# Patient Record
Sex: Male | Born: 1990 | Race: White | Hispanic: No | Marital: Single | Smoking: Never smoker
Health system: Southern US, Community
[De-identification: ages and names within clinical notes are randomized; demographics above are authoritative.]

## PROBLEM LIST (undated history)

## (undated) DIAGNOSIS — K219 Gastro-esophageal reflux disease without esophagitis: Secondary | ICD-10-CM

## (undated) HISTORY — PX: VARICOCELECTOMY: SHX1084

## (undated) HISTORY — PX: APPENDECTOMY: SHX54

## (undated) HISTORY — PX: TONSILLECTOMY: SUR1361

---

## 2017-12-25 ENCOUNTER — Other Ambulatory Visit: Payer: Self-pay

## 2017-12-25 ENCOUNTER — Encounter (HOSPITAL_COMMUNITY): Payer: Self-pay

## 2017-12-25 ENCOUNTER — Telehealth: Payer: Self-pay | Admitting: Orthopedic Surgery

## 2017-12-25 ENCOUNTER — Emergency Department (HOSPITAL_COMMUNITY)
Admission: EM | Admit: 2017-12-25 | Discharge: 2017-12-25 | Disposition: A | Discharge status: Home / Self Care | Payer: BLUE CROSS/BLUE SHIELD | Attending: Emergency Medicine | Admitting: Emergency Medicine

## 2017-12-25 DIAGNOSIS — M5431 Sciatica, right side: Secondary | ICD-10-CM | POA: Diagnosis not present

## 2017-12-25 DIAGNOSIS — M545 Low back pain: Secondary | ICD-10-CM | POA: Diagnosis present

## 2017-12-25 HISTORY — DX: Gastro-esophageal reflux disease without esophagitis: K21.9

## 2017-12-25 MED ORDER — METHYLPREDNISOLONE 4 MG PO TBPK: ORAL_TABLET | ORAL | 0 refills | Status: DC

## 2017-12-25 MED ORDER — NAPROXEN 250 MG PO TABS
500.0000 mg | ORAL_TABLET | Freq: Once | ORAL | Status: AC
  Administered 2017-12-25: 500 mg via ORAL
  Filled 2017-12-25: qty 2

## 2017-12-25 MED ORDER — METHOCARBAMOL 500 MG PO TABS: 500.0000 mg | ORAL_TABLET | Freq: Two times a day (BID) | ORAL | 0 refills | Status: DC

## 2017-12-25 MED ORDER — PREDNISONE 50 MG PO TABS
60.0000 mg | ORAL_TABLET | Freq: Once | ORAL | Status: AC
  Administered 2017-12-25: 60 mg via ORAL
  Filled 2017-12-25: qty 1

## 2017-12-25 MED ORDER — METHOCARBAMOL 500 MG PO TABS
1000.0000 mg | ORAL_TABLET | Freq: Once | ORAL | Status: AC
  Administered 2017-12-25: 1000 mg via ORAL
  Filled 2017-12-25: qty 2

## 2017-12-25 NOTE — ED Notes (Signed)
Pt states he has been in constant pain and is looking to speak to ortho or neuro or anyone who can help him feel relief from his chronic back issue.

## 2017-12-25 NOTE — Telephone Encounter (Signed)
[facility] called [facility] for problem of back pain/sciatica.  Relayed that we have no providers in clinic this week. In discussing option of next available and/or contacting primary care, patient states was already seen by his primary care doctor in [place] on Friday, 12/22/17 and had Xray done there.  States he is trying to get an MRI.  Relayed that since under care of his doctor for this problem already, to call back to their office to be further advised. York SpanielSaid will do so, and call back if needs anything.

## 2017-12-25 NOTE — ED Provider Notes (Signed)
[facility] EMERGENCY DEPARTMENT Provider Note   CSN: [id number] Arrival date & time: 12/25/17  0551     History   Chief Complaint Chief Complaint  Patient presents with  . Back Pain    HPI Steadman Prosperi is a 27 y.o. male.  Patient with right-sided low back pain that radiates down his right leg that has been progressively worsening for the past 4 days.  Denies any injury but states he was bending over a lot cleaning out his car.  He saw his PCP and was given Flexeril which he says is not helping and he had a negative x-ray.  Patient states he has had back problems on and off for the past 10 years always in this same location.  He  has seen chiropractors and orthopedic doctors in other cities.  He has not had surgery.  He denies any change in his chronic pain location and chronic pain pattern.  No weakness, numbness, tingling.  No bowel or bladder incontinence.  No fever or vomiting.  No history of cancer or IV drug use.  He denies any pain with urination or blood in the urine.  He is also been trying gabapentin which he has at home from previous testicular surgery.  He denies any testicle pain today.  His PCP referred him to [facility] orthopedics but he states he cannot be seen for 3 weeks.  The history is provided by the patient.  Back Pain   Pertinent negatives include no chest pain, no fever, no headaches, no abdominal pain, no dysuria and no weakness.    Past Medical History:  Diagnosis Date  . GERD (gastroesophageal reflux disease)     There are no active problems to display for this patient.   Past Surgical History:  Procedure Laterality Date  . APPENDECTOMY    . TONSILLECTOMY          Home Medications    Prior to Admission medications   Medication Sig Start Date End Date Taking? Authorizing Provider  cyclobenzaprine (FLEXERIL) 10 MG tablet  12/22/17  Yes [provider]  gabapentin (NEURONTIN) 300 MG capsule  10/19/17  Yes [provider]    pantoprazole (PROTONIX) 40 MG tablet  12/12/17  Yes [provider]    Family History Family History  Problem Relation Age of Onset  . Hyperlipidemia Mother   . Hypertension Mother   . Hypertension Father   . Diabetes Father     Social History Social History   Tobacco Use  . Smoking status: Never Smoker  . Smokeless tobacco: Never Used  Substance Use Topics  . Alcohol use: Never    Frequency: Never  . Drug use: Never     Allergies   Oxycodone and Percocet [oxycodone-acetaminophen]   Review of Systems Review of Systems  Constitutional: Negative for activity change, appetite change and fever.  HENT: Negative for congestion.   Eyes: Negative for visual disturbance.  Respiratory: Negative for cough, chest tightness and shortness of breath.   Cardiovascular: Negative for chest pain.  Gastrointestinal: Negative for abdominal pain, nausea and vomiting.  Genitourinary: Negative for dysuria and hematuria.  Musculoskeletal: Positive for arthralgias, back pain and myalgias.  Skin: Negative for rash.  Neurological: Negative for dizziness, weakness and headaches.    all other systems are negative except as noted in the HPI and PMH.    Physical Exam Updated Vital Signs BP 107/82 (BP Location: Left Arm)   Pulse 86   Temp 97.8 F (36.6 C) (Oral)  Resp 16   Ht 5\' 10"  (1.778 m)   Wt 129.3 kg (285 lb)   SpO2 100%   BMI 40.89 kg/m   Physical Exam  Constitutional: He is oriented to person, place, and time. He appears well-developed and well-nourished. No distress.  obese  HENT:  Head: Normocephalic and atraumatic.  Mouth/Throat: Oropharynx is clear and moist. No oropharyngeal exudate.  Eyes: Pupils are equal, round, and reactive to light. Conjunctivae and EOM are normal.  Neck: Normal range of motion. Neck supple.  No meningismus.  Cardiovascular: Normal rate, regular rhythm, normal heart sounds and intact distal pulses.  No murmur heard. Pulmonary/Chest:  Effort normal and breath sounds normal. No respiratory distress.  Abdominal: Soft. There is no tenderness. There is no rebound and no guarding.  Musculoskeletal: Normal range of motion. He exhibits tenderness. He exhibits no edema.  Right SI joint tenderness 5/5 strength in bilateral lower extremities. Ankle plantar and dorsiflexion intact. Great toe extension intact bilaterally. +2 DP and PT pulses. +2 patellar reflexes bilaterally. Antalgic gait   Neurological: He is alert and oriented to person, place, and time. No cranial nerve deficit. He exhibits normal muscle tone. Coordination normal.  No ataxia on finger to nose bilaterally. No pronator drift. 5/5 strength throughout. CN 2-12 intact.Equal grip strength. Sensation intact.   Skin: Skin is warm.  Psychiatric: He has a normal mood and affect. His behavior is normal.  Nursing note and vitals reviewed.    ED Treatments / Results  Labs (all labs ordered are listed, but only abnormal results are displayed) Labs Reviewed - No data to display  EKG None  Radiology No results found.  Procedures Procedures (including critical care time)  Medications Ordered in ED Medications - No data to display   Initial Impression / Assessment and Plan / ED Course  I have reviewed the triage vital signs and the nursing notes.  Pertinent labs & imaging results that were available during my care of the patient were reviewed by me and considered in my medical decision making (see chart for details).    Acute on chronic right-sided low back pain with sciatica.  Patient with normal strength and sensation and reflexes today.  No neurological red flags.  Low suspicion for cord compression or cauda equina. Patient is not requesting narcotic pain medication states it makes him sick.  He agrees with nonnarcotic treatments.    Patient will be treated with steroids and anti-inflammatories.  He will be referred to local orthopedics by his request.  No  indication for emergent MRI today.  Return precautions discussed. Final Clinical Impressions(s) / ED Diagnoses   Final diagnoses:  Sciatica of right side    ED Discharge Orders    None       Winter Trefz, Jeannett SeniorStephen, MD 12/25/17 [phone]

## 2017-12-25 NOTE — Discharge Instructions (Addendum)
Follow-up with the orthopedic doctor for an MRI.  Avoid heavy lifting.  Return to the ED if you develop new weakness, numbness, incontinence, fever, vomiting or worsening pain or any other concerns.

## 2017-12-25 NOTE — ED Triage Notes (Signed)
Pt arrives from home via POV c/o lumbar back pain. Pt states he has been treated for herniated discs in the past and has not felt relief from previous therapy.

## 2018-04-08 ENCOUNTER — Other Ambulatory Visit: Payer: Self-pay

## 2018-04-08 ENCOUNTER — Encounter (HOSPITAL_COMMUNITY): Payer: Self-pay | Admitting: Emergency Medicine

## 2018-04-08 ENCOUNTER — Emergency Department (HOSPITAL_COMMUNITY)
Admission: EM | Admit: 2018-04-08 | Discharge: 2018-04-09 | Disposition: A | Discharge status: Home / Self Care | Payer: BLUE CROSS/BLUE SHIELD | Attending: Emergency Medicine | Admitting: Emergency Medicine

## 2018-04-08 ENCOUNTER — Emergency Department (HOSPITAL_COMMUNITY): Payer: BLUE CROSS/BLUE SHIELD

## 2018-04-08 DIAGNOSIS — Z79899 Other long term (current) drug therapy: Secondary | ICD-10-CM | POA: Insufficient documentation

## 2018-04-08 DIAGNOSIS — R091 Pleurisy: Secondary | ICD-10-CM | POA: Diagnosis not present

## 2018-04-08 DIAGNOSIS — R079 Chest pain, unspecified: Secondary | ICD-10-CM | POA: Diagnosis present

## 2018-04-08 MED ORDER — SODIUM CHLORIDE 0.9 % IV BOLUS: 1000.0000 mL | Freq: Once | INTRAVENOUS | Status: DC

## 2018-04-08 MED ORDER — KETOROLAC TROMETHAMINE 30 MG/ML IJ SOLN: 15.0000 mg | Freq: Once | INTRAMUSCULAR | Status: DC

## 2018-04-08 NOTE — ED Notes (Signed)
Patient transported to X-ray 

## 2018-04-08 NOTE — ED Triage Notes (Signed)
Pt c/o chest pain and SOB x 5 days

## 2018-04-09 LAB — COMPREHENSIVE METABOLIC PANEL
ALBUMIN: 4 g/dL (ref 3.5–5.0)
ALT: 28 U/L (ref 0–44)
ANION GAP: 6 (ref 5–15)
AST: 17 U/L (ref 15–41)
Alkaline Phosphatase: 68 U/L (ref 38–126)
BUN: 14 mg/dL (ref 6–20)
CO2: 27 mmol/L (ref 22–32)
Calcium: 9 mg/dL (ref 8.9–10.3)
Chloride: 105 mmol/L (ref 98–111)
Creatinine, Ser: 0.77 mg/dL (ref 0.61–1.24)
Glucose, Bld: 94 mg/dL (ref 70–99)
POTASSIUM: 3.8 mmol/L (ref 3.5–5.1)
Sodium: 138 mmol/L (ref 135–145)
Total Bilirubin: 0.6 mg/dL (ref 0.3–1.2)
Total Protein: 6.9 g/dL (ref 6.5–8.1)

## 2018-04-09 LAB — CBC
HCT: 42.1 % (ref 39.0–52.0)
Hemoglobin: 13.6 g/dL (ref 13.0–17.0)
MCH: 26.3 pg (ref 26.0–34.0)
MCHC: 32.3 g/dL (ref 30.0–36.0)
MCV: 81.3 fL (ref 78.0–100.0)
Platelets: 272 10*3/uL (ref 150–400)
RBC: 5.18 MIL/uL (ref 4.22–5.81)
RDW: 13.4 % (ref 11.5–15.5)
WBC: 9.6 10*3/uL (ref 4.0–10.5)

## 2018-04-09 LAB — D-DIMER, QUANTITATIVE: D-Dimer, Quant: <0.27 ug/mL-FEU (ref 0.00–0.50)

## 2018-04-09 LAB — TROPONIN I: Troponin I: <0.03 ng/mL (ref <0.03)

## 2018-04-09 MED ORDER — PREDNISONE 50 MG PO TABS
60.0000 mg | ORAL_TABLET | Freq: Once | ORAL | Status: AC
  Administered 2018-04-09: 60 mg via ORAL
  Filled 2018-04-09: qty 1

## 2018-04-09 MED ORDER — PREDNISONE 50 MG PO TABS: ORAL_TABLET | ORAL | 0 refills | Status: AC

## 2018-04-09 MED ORDER — KETOROLAC TROMETHAMINE 30 MG/ML IJ SOLN
30.0000 mg | Freq: Once | INTRAMUSCULAR | Status: AC
Start: 2018-04-09 — End: 2018-04-09
  Administered 2018-04-09: 30 mg via INTRAMUSCULAR
  Filled 2018-04-09: qty 1

## 2018-04-09 MED ORDER — KETOROLAC TROMETHAMINE 30 MG/ML IJ SOLN
30.0000 mg | Freq: Once | INTRAMUSCULAR | Status: DC
Start: 2018-04-09 — End: 2018-04-09

## 2018-04-09 NOTE — Discharge Instructions (Addendum)
Take your next dose of the prednisone Monday night - take with a snack. Plan a recheck by your provider by the end of this week for any persistent or worsened symptoms.  As discussed, your labs, ekg tracing and your chest xray are negative for any obvious cardiac source for your symptoms, blood clot (pulmonary embolus) or pneumonia.

## 2018-04-09 NOTE — ED Notes (Signed)
Pt given two 8 oz glasses of water and one 8 oz sprite- tolerating well.
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
## 2018-04-18 ENCOUNTER — Emergency Department (HOSPITAL_COMMUNITY): Payer: BLUE CROSS/BLUE SHIELD

## 2018-04-18 ENCOUNTER — Encounter (HOSPITAL_COMMUNITY): Payer: Self-pay | Admitting: Emergency Medicine

## 2018-04-18 ENCOUNTER — Emergency Department (HOSPITAL_COMMUNITY)
Admission: EM | Admit: 2018-04-18 | Discharge: 2018-04-18 | Disposition: A | Discharge status: Home / Self Care | Payer: BLUE CROSS/BLUE SHIELD | Attending: Emergency Medicine | Admitting: Emergency Medicine

## 2018-04-18 ENCOUNTER — Other Ambulatory Visit: Payer: Self-pay

## 2018-04-18 DIAGNOSIS — R197 Diarrhea, unspecified: Secondary | ICD-10-CM | POA: Insufficient documentation

## 2018-04-18 DIAGNOSIS — R109 Unspecified abdominal pain: Secondary | ICD-10-CM | POA: Insufficient documentation

## 2018-04-18 DIAGNOSIS — Z79899 Other long term (current) drug therapy: Secondary | ICD-10-CM | POA: Diagnosis not present

## 2018-04-18 DIAGNOSIS — R112 Nausea with vomiting, unspecified: Secondary | ICD-10-CM | POA: Insufficient documentation

## 2018-04-18 LAB — URINALYSIS, ROUTINE W REFLEX MICROSCOPIC
BILIRUBIN URINE: NEGATIVE
Bacteria, UA: NONE SEEN
Glucose, UA: NEGATIVE mg/dL
KETONES UR: 5 mg/dL — AB
Leukocytes, UA: NEGATIVE
NITRITE: NEGATIVE
PROTEIN: NEGATIVE mg/dL
Specific Gravity, Urine: 1.024 (ref 1.005–1.030)
pH: 5 (ref 5.0–8.0)

## 2018-04-18 LAB — COMPREHENSIVE METABOLIC PANEL
ALK PHOS: 68 U/L (ref 38–126)
ALT: 26 U/L (ref 0–44)
ANION GAP: 9 (ref 5–15)
AST: 18 U/L (ref 15–41)
Albumin: 4.3 g/dL (ref 3.5–5.0)
BUN: 12 mg/dL (ref 6–20)
CALCIUM: 9.1 mg/dL (ref 8.9–10.3)
CO2: 28 mmol/L (ref 22–32)
Chloride: 101 mmol/L (ref 98–111)
Creatinine, Ser: 1 mg/dL (ref 0.61–1.24)
GFR calc non Af Amer: >60 mL/min (ref >60)
Glucose, Bld: 86 mg/dL (ref 70–99)
Potassium: 4.1 mmol/L (ref 3.5–5.1)
SODIUM: 138 mmol/L (ref 135–145)
Total Bilirubin: 1.2 mg/dL (ref 0.3–1.2)
Total Protein: 7.4 g/dL (ref 6.5–8.1)

## 2018-04-18 LAB — CBC
HCT: 43.2 % (ref 39.0–52.0)
HEMOGLOBIN: 13.9 g/dL (ref 13.0–17.0)
MCH: 26.3 pg (ref 26.0–34.0)
MCHC: 32.2 g/dL (ref 30.0–36.0)
MCV: 81.8 fL (ref 78.0–100.0)
Platelets: 290 10*3/uL (ref 150–400)
RBC: 5.28 MIL/uL (ref 4.22–5.81)
RDW: 13.3 % (ref 11.5–15.5)
WBC: 13.1 10*3/uL — ABNORMAL HIGH (ref 4.0–10.5)

## 2018-04-18 LAB — LIPASE, BLOOD: LIPASE: 24 U/L (ref 11–51)

## 2018-04-18 MED ORDER — MORPHINE SULFATE (PF) 4 MG/ML IV SOLN
4.0000 mg | Freq: Once | INTRAVENOUS | Status: DC
  Filled 2018-04-18: qty 1

## 2018-04-18 MED ORDER — ONDANSETRON HCL 4 MG/2ML IJ SOLN
4.0000 mg | Freq: Once | INTRAMUSCULAR | Status: AC
  Administered 2018-04-18: 4 mg via INTRAVENOUS
  Filled 2018-04-18: qty 2

## 2018-04-18 MED ORDER — IOPAMIDOL (ISOVUE-300) INJECTION 61%: 100.0000 mL | Freq: Once | INTRAVENOUS | Status: DC | PRN

## 2018-04-18 MED ORDER — IOHEXOL 300 MG/ML  SOLN
100.0000 mL | Freq: Once | INTRAMUSCULAR | Status: AC | PRN
  Administered 2018-04-18: 100 mL via INTRAVENOUS

## 2018-04-18 MED ORDER — SODIUM CHLORIDE 0.9 % IV BOLUS
1000.0000 mL | Freq: Once | INTRAVENOUS | Status: AC
  Administered 2018-04-18: 1000 mL via INTRAVENOUS

## 2018-04-18 MED ORDER — PANTOPRAZOLE SODIUM 20 MG PO TBEC: 20.0000 mg | DELAYED_RELEASE_TABLET | Freq: Every day | ORAL | 0 refills | Status: AC

## 2018-04-18 MED ORDER — DIPHENOXYLATE-ATROPINE 2.5-0.025 MG PO TABS: 1.0000 | ORAL_TABLET | Freq: Four times a day (QID) | ORAL | 0 refills | Status: AC | PRN

## 2018-04-18 NOTE — ED Triage Notes (Signed)
Pt c/o n/v/d. Seen pcp. Was given zofran. No vomiting since yesterday. But diarrhea is worse. Blood in stool Monday but not since. 5 episodes of diarrhea today

## 2018-04-18 NOTE — ED Provider Notes (Signed)
[facility] EMERGENCY DEPARTMENT Provider Note   CSN: [id number] Arrival date & time: 04/18/18  1439     History   Chief Complaint Chief Complaint  Patient presents with  . Emesis  . Diarrhea    HPI Fernando Ward is a 27 y.o. male.  Multiple bouts of nausea, vomiting, diarrhea for several days.  Patient was given Zofran by his primary care doctor.  He noticed a small amount of blood in his stool on Monday but none since then.  No mucus in his stool.  No previous gastrointestinal pathology.  He blames and symptoms on something he ate.  Severity is moderate.  Nothing makes symptoms better or worse.  Review of systems positive for abdominal cramping     Past Medical History:  Diagnosis Date  . GERD (gastroesophageal reflux disease)     There are no active problems to display for this patient.   Past Surgical History:  Procedure Laterality Date  . APPENDECTOMY    . TONSILLECTOMY    . VARICOCELECTOMY          Home Medications    Prior to Admission medications   Medication Sig Start Date End Date Taking? Authorizing Provider  bismuth subsalicylate (PEPTO BISMOL) 262 MG/15ML suspension Take 30 mLs by mouth every 6 (six) hours as needed for indigestion.   Yes [provider]  ranitidine (ZANTAC) 150 MG tablet Take 150 mg by mouth daily as needed for heartburn.    Yes [provider]  amoxicillin-clavulanate (AUGMENTIN) 875-125 MG tablet Take 1 tablet by mouth 2 (two) times daily. for 10 days 04/13/18   [provider]  diphenoxylate-atropine (LOMOTIL) 2.5-0.025 MG tablet Take 1 tablet by mouth 4 (four) times daily as needed for diarrhea or loose stools. 04/18/18   Donnetta Hutchingook, Amato Sevillano, MD  ondansetron (ZOFRAN) 4 MG tablet Take 4 mg by mouth every 8 (eight) hours as needed for nausea or vomiting.  04/17/18   [provider]  pantoprazole (PROTONIX) 20 MG tablet Take 1 tablet (20 mg total) by mouth daily. 04/18/18   Donnetta Hutchingook, Flonnie Wierman, MD  predniSONE  (DELTASONE) 50 MG tablet Take one tablet qhs for 5 days. Patient not taking: Reported on 04/18/2018 04/09/18   Burgess AmorIdol, Julie, PA-C    Family History Family History  Problem Relation Age of Onset  . Hyperlipidemia Mother   . Hypertension Mother   . Hypertension Father   . Diabetes Father     Social History Social History   Tobacco Use  . Smoking status: Never Smoker  . Smokeless tobacco: Never Used  Substance Use Topics  . Alcohol use: Never    Frequency: Never  . Drug use: Never     Allergies   Oxycodone and Percocet [oxycodone-acetaminophen]   Review of Systems Review of Systems  All other systems reviewed and are negative.    Physical Exam Updated Vital Signs BP 124/72 (BP Location: Left Arm)   Pulse 84   Temp 98.1 F (36.7 C) (Temporal)   Resp 16   Ht 5\' 10"  (1.778 m)   Wt 129.7 kg   SpO2 100%   BMI 41.04 kg/m   Physical Exam  Constitutional: He is oriented to person, place, and time. He appears well-developed and well-nourished.  HENT:  Head: Normocephalic and atraumatic.  Eyes: Conjunctivae are normal.  Neck: Neck supple.  Cardiovascular: Normal rate and regular rhythm.  Pulmonary/Chest: Effort normal and breath sounds normal.  Abdominal: Bowel sounds are normal.  Minimal diffuse tenderness.  Musculoskeletal: Normal range  of motion.  Neurological: He is alert and oriented to person, place, and time.  Skin: Skin is warm and dry.  Psychiatric: He has a normal mood and affect. His behavior is normal.  Nursing note and vitals reviewed.    ED Treatments / Results  Labs (all labs ordered are listed, but only abnormal results are displayed) Labs Reviewed  CBC - Abnormal; Notable for the following components:      Result Value   WBC 13.1 (*)    All other components within normal limits  URINALYSIS, ROUTINE W REFLEX MICROSCOPIC - Abnormal; Notable for the following components:   APPearance HAZY (*)    Hgb urine dipstick SMALL (*)    Ketones, ur 5  (*)    All other components within normal limits  LIPASE, BLOOD  COMPREHENSIVE METABOLIC PANEL    EKG None  Radiology Ct Abdomen Pelvis W Contrast  Result Date: 04/18/2018 CLINICAL DATA:  Initial evaluation for acute generalized abdominal pain, weakness. EXAM: CT ABDOMEN AND PELVIS WITH CONTRAST TECHNIQUE: Multidetector CT imaging of the abdomen and pelvis was performed using the standard protocol following bolus administration of intravenous contrast. CONTRAST:  100mL OMNIPAQUE IOHEXOL 300 MG/ML  SOLN COMPARISON:  None available. FINDINGS: Lower chest: Visualized lung bases are clear. Hepatobiliary: Liver demonstrates a normal contrast enhanced appearance. Gallbladder within normal limits. No biliary dilatation. Pancreas: Pancreas within normal limits. Spleen: Spleen within normal limits. Adrenals/Urinary Tract: Adrenal glands are normal. Kidneys equal in size with symmetric enhancement no nephrolithiasis, hydronephrosis, or focal enhancing renal mass. No hydroureter. Partially distended bladder within normal limits. Stomach/Bowel: Stomach within normal limits. No evidence for bowel obstruction. Appendix is surgically absent. Colon diffusely decompressed. Mildly prominent retained stool within the cecum. No acute inflammatory changes about the bowels. Vascular/Lymphatic: Normal intravascular enhancement seen throughout the intra-abdominal aorta. No aneurysm. Mesenteric vessels patent proximally. No adenopathy. Reproductive: Prostate normal. Other: No free air or fluid Musculoskeletal: No acute osseous abnormality. No worrisome lytic or blastic osseous lesions. IMPRESSION: 1. No CT evidence for acute intra-abdominal or pelvic process. 2. Prior appendectomy. Electronically Signed   By: Rise MuBenjamin  McClintock M.D.   On: 04/18/2018 18:37    Procedures Procedures (including critical care time)  Medications Ordered in ED Medications  morphine 4 MG/ML injection 4 mg (4 mg Intravenous Refused 04/18/18  1633)  iopamidol (ISOVUE-300) 61 % injection 100 mL (has no administration in time range)  ondansetron (ZOFRAN) injection 4 mg (4 mg Intravenous Given 04/18/18 1632)  sodium chloride 0.9 % bolus 1,000 mL (0 mLs Intravenous Stopped 04/18/18 1828)  sodium chloride 0.9 % bolus 1,000 mL (0 mLs Intravenous Stopped 04/18/18 1828)  iohexol (OMNIPAQUE) 300 MG/ML solution 100 mL (100 mLs Intravenous Contrast Given 04/18/18 1745)     Initial Impression / Assessment and Plan / ED Course  I have reviewed the triage vital signs and the nursing notes.  Pertinent labs & imaging results that were available during my care of the patient were reviewed by me and considered in my medical decision making (see chart for details).     Patient presents with nausea, vomiting, diarrhea.  He is hemodynamically stable.  No acute abdomen.  Labs and CT abdomen pelvis show no acute pathology.  Patient responded well to 2 L of IV fluids, IV Zofran.  Discharge medications Lomotil and Protonix 20 mg.  Discussed with patient  Final Clinical Impressions(s) / ED Diagnoses   Final diagnoses:  Nausea vomiting and diarrhea    ED Discharge Orders  Ordered    diphenoxylate-atropine (LOMOTIL) 2.5-0.025 MG tablet  4 times daily PRN     04/18/18 2003    pantoprazole (PROTONIX) 20 MG tablet  Daily     04/18/18 Juleen Starr, MD 04/18/18 2012

## 2018-04-18 NOTE — Discharge Instructions (Addendum)
Tests showed no life-threatening condition.  Prescription for diarrhea medicine and stomach medicine.  Continue your Zofran.  Clear liquids.  Follow-up your primary care doctor.

## 2018-04-18 NOTE — ED Notes (Signed)
Pt refused Morphine. This nurse had already drawn up into syringe. Have dispensed it into the sharps.

## 2018-12-09 IMAGING — CT CT ABD-PELV W/ CM
2 of 4 series · 16 of 46 positions shown, 18 images · IV contrast (Isovue)
Comparison: None available.

CLINICAL DATA: Initial evaluation for acute generalized abdominal
pain, weakness.

EXAM:
CT ABDOMEN AND PELVIS WITH CONTRAST
TECHNIQUE: Multidetector CT imaging of the abdomen and pelvis was performed
using the standard protocol following bolus administration of
intravenous contrast.
CONTRAST:  100mL OMNIPAQUE IOHEXOL 300 MG/ML  SOLN

[Series 2: axial st · axial · 0.79mm/px · z∈[-798,-328]mm · 13 of 104 slices shown, 15 images]
[im 5/104  soft-tissue]
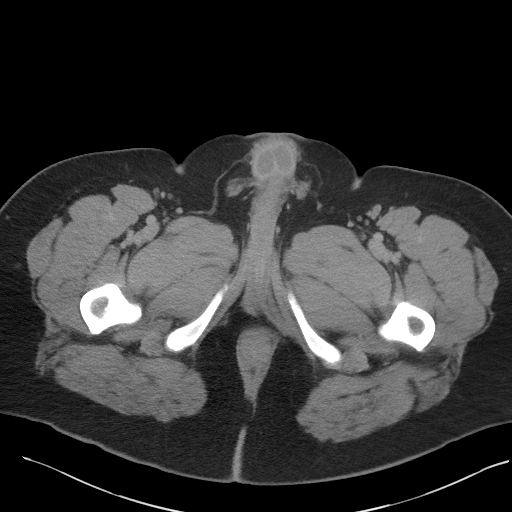
[im 5/104  bone]
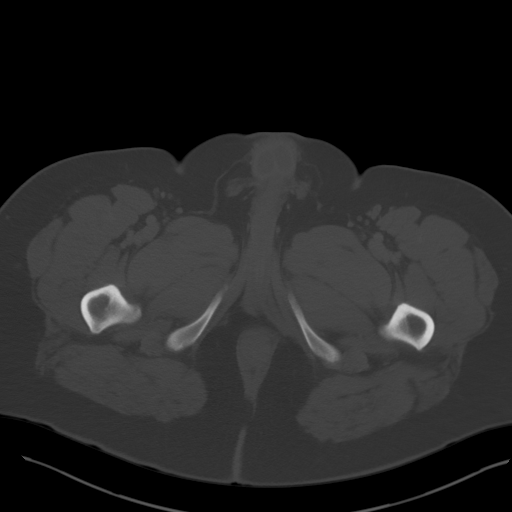
[im 14/104  soft-tissue]
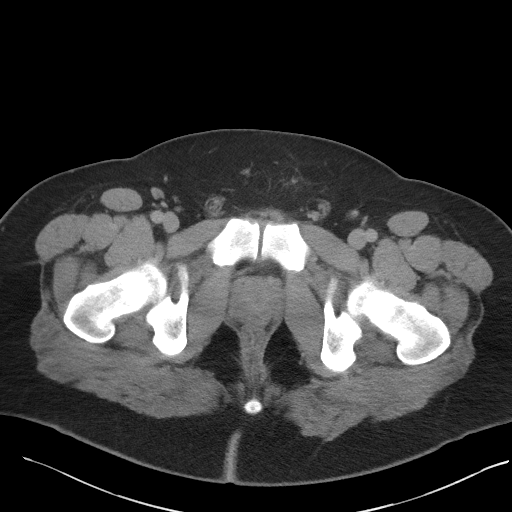
[im 23/104  soft-tissue]
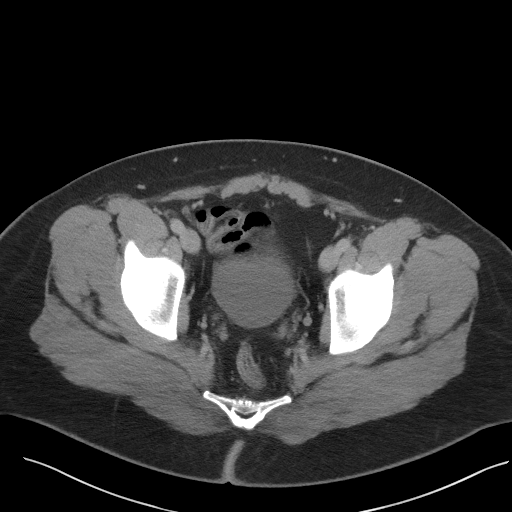
[im 27/104  soft-tissue]
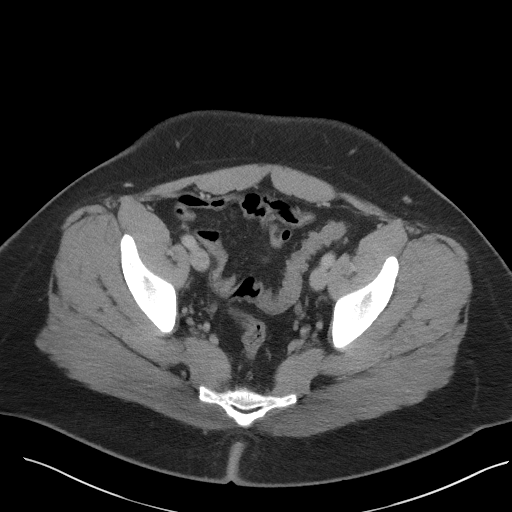
[im 36/104  soft-tissue]
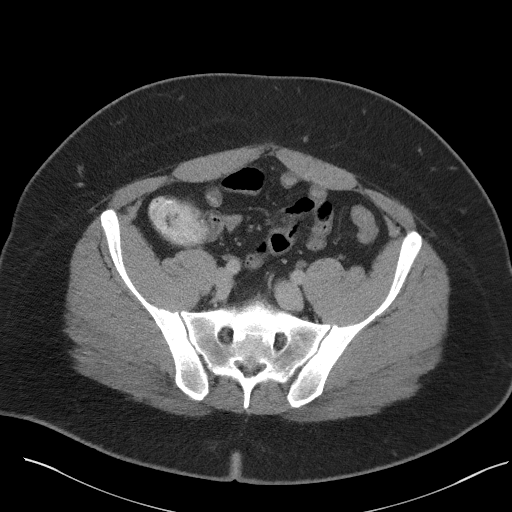
[im 45/104  soft-tissue]
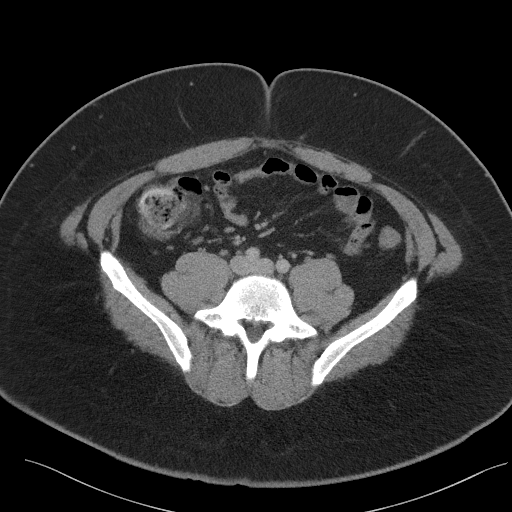
[im 54/104  soft-tissue]
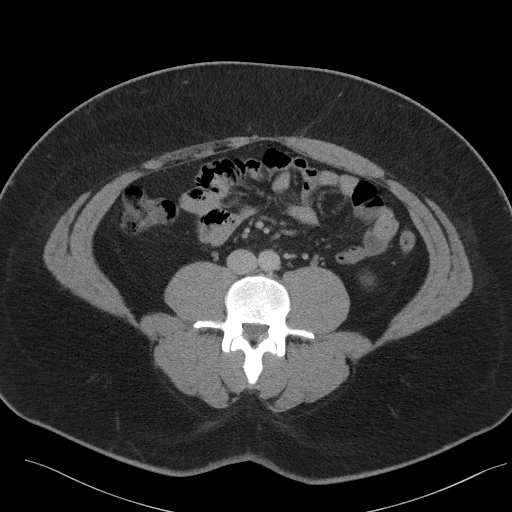
[im 59/104  soft-tissue]
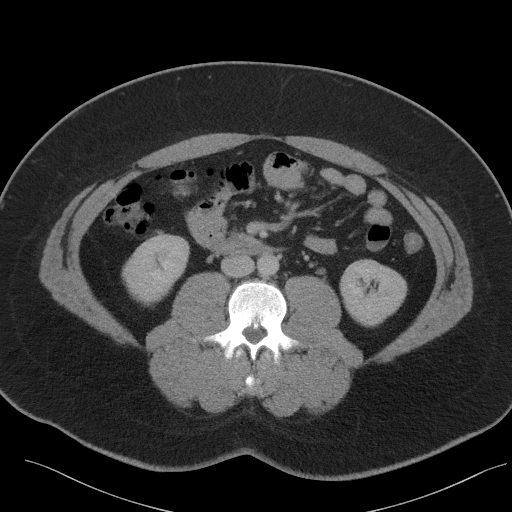
[im 68/104  soft-tissue]
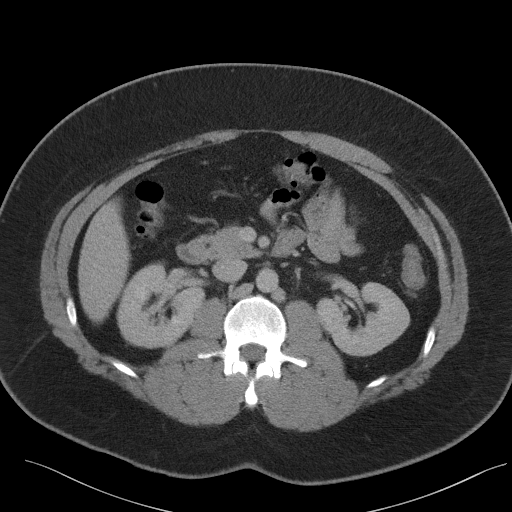
[im 68/104  bone]
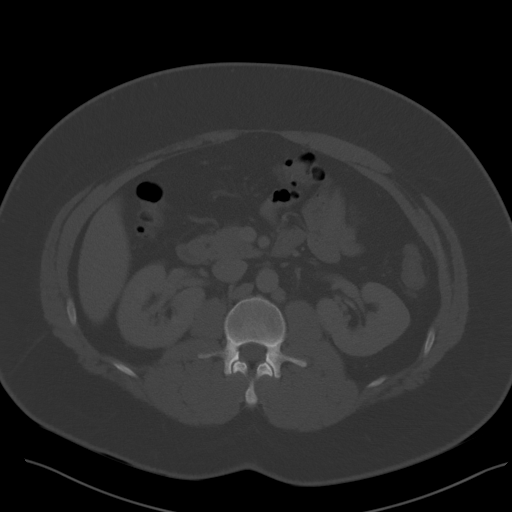
[im 77/104  soft-tissue]
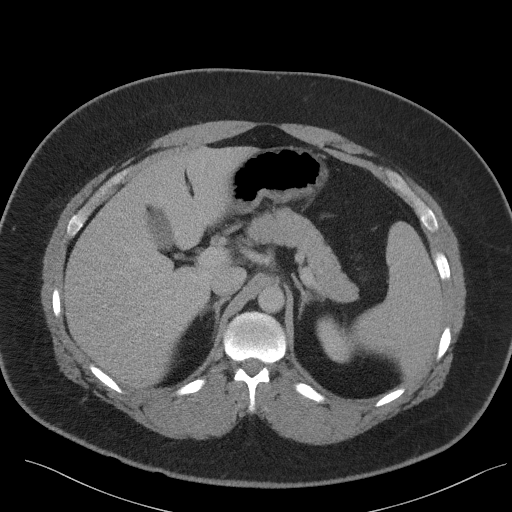
[im 81/104  soft-tissue]
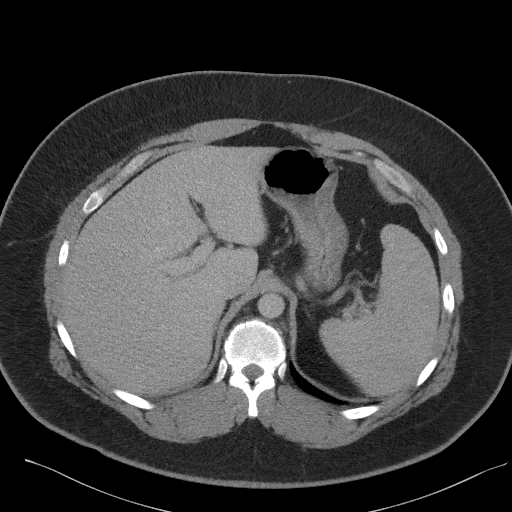
[im 90/104  soft-tissue]
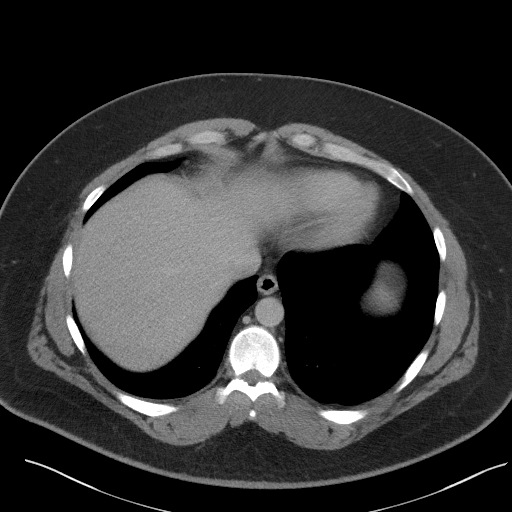
[im 99/104  soft-tissue]
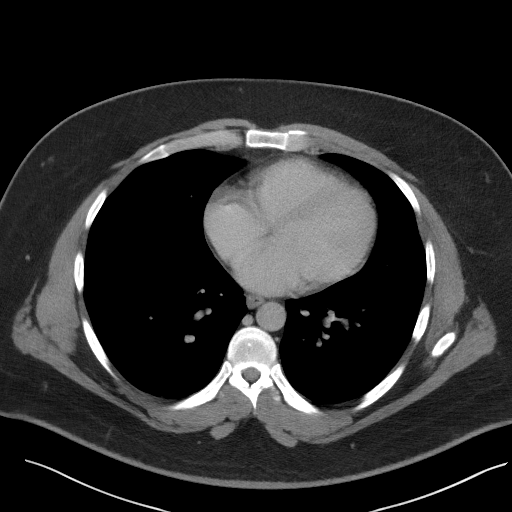

[Series 5: coronal st · coronal · 0.80mm/px · 3 of 109 slices shown]
[im 37/109  soft-tissue]
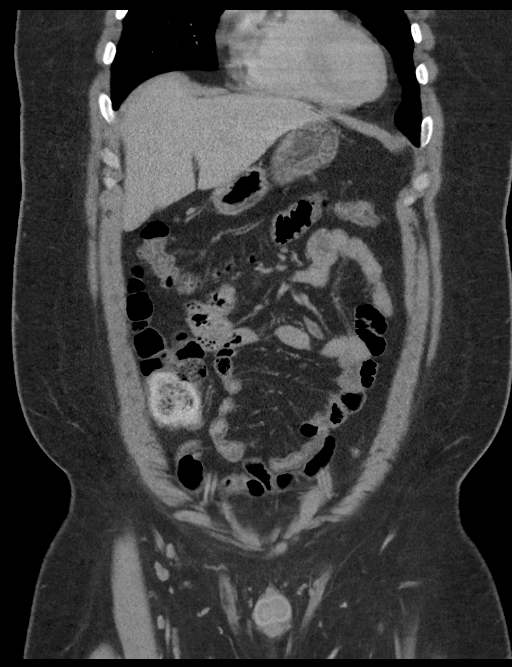
[im 49/109  soft-tissue]
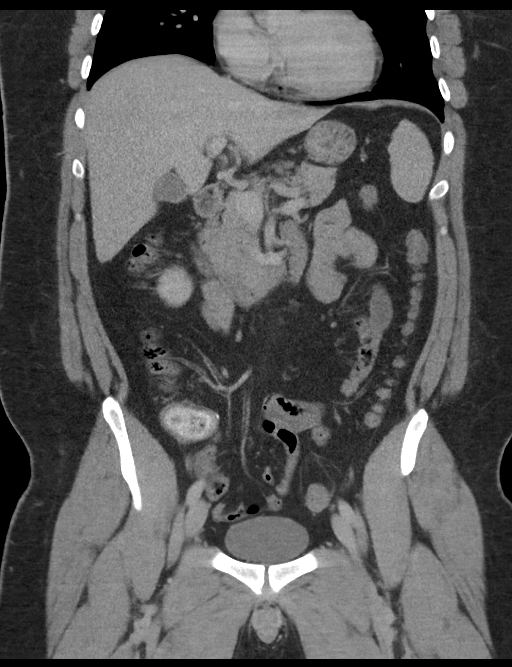
[im 61/109  soft-tissue]
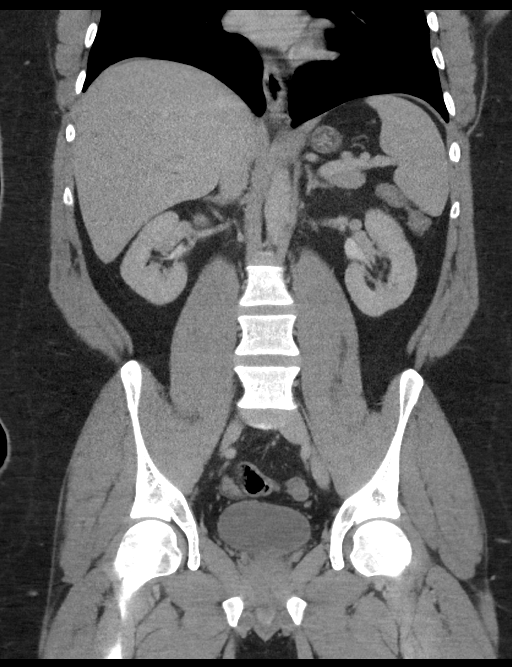

[16 of 46 positions shown; findings below may reference images not displayed]

FINDINGS: Lower chest: Visualized lung bases are clear.

Hepatobiliary: Liver demonstrates a normal contrast enhanced
appearance. Gallbladder within normal limits. No biliary dilatation.

Pancreas: Pancreas within normal limits.

Spleen: Spleen within normal limits.

Adrenals/Urinary Tract: Adrenal glands are normal. Kidneys equal in
size with symmetric enhancement no nephrolithiasis, hydronephrosis,
or focal enhancing renal mass. No hydroureter. Partially distended
bladder within normal limits.

Stomach/Bowel: Stomach within normal limits. No evidence for bowel
obstruction. Appendix is surgically absent. Colon diffusely
decompressed. Mildly prominent retained stool within the cecum. No
acute inflammatory changes about the bowels.

Vascular/Lymphatic: Normal intravascular enhancement seen throughout
the intra-abdominal aorta. No aneurysm. Mesenteric vessels patent
proximally. No adenopathy.

Reproductive: Prostate normal.

Other: No free air or fluid

Musculoskeletal: No acute osseous abnormality. No worrisome lytic or
blastic osseous lesions.
IMPRESSION: 1. No CT evidence for acute intra-abdominal or pelvic process.
2. Prior appendectomy.
